# Patient Record
Sex: Female | Born: 2000 | Race: White | Hispanic: No | Marital: Single | Smoking: Never smoker
Health system: Southern US, Community
[De-identification: ages and names within clinical notes are randomized; demographics above are authoritative.]

## PROBLEM LIST (undated history)

## (undated) ENCOUNTER — Inpatient Hospital Stay (HOSPITAL_COMMUNITY): Payer: Self-pay

## (undated) DIAGNOSIS — B009 Herpesviral infection, unspecified: Secondary | ICD-10-CM

## (undated) DIAGNOSIS — J45909 Unspecified asthma, uncomplicated: Secondary | ICD-10-CM

## (undated) HISTORY — PX: NO PAST SURGERIES: SHX2092

---
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
## 2018-04-09 ENCOUNTER — Other Ambulatory Visit: Payer: Self-pay

## 2018-04-09 ENCOUNTER — Encounter (HOSPITAL_COMMUNITY): Payer: Self-pay | Admitting: Emergency Medicine

## 2018-04-09 ENCOUNTER — Emergency Department (HOSPITAL_COMMUNITY)
Admission: EM | Admit: 2018-04-09 | Discharge: 2018-04-09 | Disposition: A | Discharge status: Home / Self Care | Payer: Medicaid Other | Attending: Emergency Medicine | Admitting: Emergency Medicine

## 2018-04-09 DIAGNOSIS — L0291 Cutaneous abscess, unspecified: Secondary | ICD-10-CM

## 2018-04-09 DIAGNOSIS — L02415 Cutaneous abscess of right lower limb: Secondary | ICD-10-CM | POA: Insufficient documentation

## 2018-04-09 DIAGNOSIS — J45909 Unspecified asthma, uncomplicated: Secondary | ICD-10-CM | POA: Insufficient documentation

## 2018-04-09 MED ORDER — ONDANSETRON HCL 4 MG PO TABS
4.0000 mg | ORAL_TABLET | Freq: Once | ORAL | Status: AC
  Administered 2018-04-09: 4 mg via ORAL
  Filled 2018-04-09: qty 1

## 2018-04-09 MED ORDER — TRAMADOL HCL 50 MG PO TABS: 50.0000 mg | ORAL_TABLET | Freq: Four times a day (QID) | ORAL | 0 refills | Status: AC | PRN

## 2018-04-09 MED ORDER — IBUPROFEN 800 MG PO TABS
800.0000 mg | ORAL_TABLET | Freq: Once | ORAL | Status: AC
  Administered 2018-04-09: 800 mg via ORAL
  Filled 2018-04-09: qty 1

## 2018-04-09 MED ORDER — LIDOCAINE HCL (PF) 1 % IJ SOLN
INTRAMUSCULAR | Status: AC
  Filled 2018-04-09: qty 10

## 2018-04-09 MED ORDER — IBUPROFEN 600 MG PO TABS: 600.0000 mg | ORAL_TABLET | Freq: Four times a day (QID) | ORAL | 0 refills | Status: AC

## 2018-04-09 MED ORDER — TRAMADOL HCL 50 MG PO TABS
100.0000 mg | ORAL_TABLET | Freq: Once | ORAL | Status: AC
  Administered 2018-04-09: 100 mg via ORAL
  Filled 2018-04-09: qty 2

## 2018-04-09 MED ORDER — DOXYCYCLINE HYCLATE 100 MG PO CAPS: 100.0000 mg | ORAL_CAPSULE | Freq: Two times a day (BID) | ORAL | 0 refills | Status: AC

## 2018-04-09 MED ORDER — DOXYCYCLINE HYCLATE 100 MG PO TABS
100.0000 mg | ORAL_TABLET | Freq: Once | ORAL | Status: AC
  Administered 2018-04-09: 100 mg via ORAL
  Filled 2018-04-09: qty 1

## 2018-04-09 MED ORDER — POVIDONE-IODINE 10 % EX SOLN
CUTANEOUS | Status: AC
  Filled 2018-04-09: qty 15

## 2018-04-09 MED ORDER — PREDNISONE 20 MG PO TABS
40.0000 mg | ORAL_TABLET | Freq: Once | ORAL | Status: AC
  Administered 2018-04-09: 40 mg via ORAL
  Filled 2018-04-09: qty 2

## 2018-04-09 NOTE — Discharge Instructions (Signed)
Please use warm Epson salt tub soaks for about 10 to 15 minutes daily.  Please use doxycycline 2 times daily with food.  Use ibuprofen every 6 hours or 4 times a day with food.  Please have the abscess and cellulitis area rechecked in about 3 days.  Either by your primary physician or return to the emergency department for recheck.

## 2018-04-09 NOTE — ED Provider Notes (Signed)
[facility] EMERGENCY DEPARTMENT Provider Note   CSN: [id number] Arrival date & time: 04/09/18  1458     History   Chief Complaint Chief Complaint  Patient presents with  . Abscess    HPI Monica Davenport is a 17 y.o. female.  The history is provided by the patient.  Abscess  Location:  Leg Leg abscess location:  R upper leg Abscess quality: painful, redness and warmth   Abscess quality: not draining   Red streaking: no   Duration:  4 days Progression:  Worsening Pain details:    Quality:  Pressure and throbbing   Severity:  Moderate   Duration:  4 days   Timing:  Constant   Progression:  Worsening Chronicity:  New Context: not diabetes and not immunosuppression   Relieved by:  Nothing Exacerbated by: palpation. Ineffective treatments:  None tried Associated symptoms: no fever, no nausea and no vomiting   Risk factors: no hx of MRSA     Past Medical History:  Diagnosis Date  . Asthma   . Herpes     Patient Active Problem List   Diagnosis Date Noted  . Normal delivery 11/17/2017  . Pregnancy 11/17/2017  . Chlamydia infection affecting pregnancy in first trimester 03/24/2017    Past Surgical History:  Procedure Laterality Date  . NO PAST SURGERIES       OB History    Gravida  1   Para      Term      Preterm      AB      Living        SAB      TAB      Ectopic      Multiple      Live Births               Home Medications    Prior to Admission medications   Medication Sig Start Date End Date Taking? Authorizing Provider  chlorpheniramine-HYDROcodone (TUSSIONEX PENNKINETIC ER) 10-8 MG/5ML SUER Take 5 mLs by mouth every 12 (twelve) hours as needed for cough. Patient not taking: Reported on 11/17/2017 09/16/17   Currie Paris, NP  Prenatal Vit-Fe Fumarate-FA (PRENATAL VITAMIN) 27-0.8 MG TABS Take 1 tablet by mouth daily. 03/23/17   Armando Reichert, CNM  valACYclovir (VALTREX) 500 MG tablet Take 500 mg by mouth daily.     [provider]    Family History No family history on file.  Social History Social History   Tobacco Use  . Smoking status: Never Smoker  . Smokeless tobacco: Never Used  Substance Use Topics  . Alcohol use: No  . Drug use: No     Allergies   Patient has no known allergies.   Review of Systems Review of Systems  Constitutional: Negative for activity change and fever.       All ROS Neg except as noted in HPI  HENT: Negative for nosebleeds.   Eyes: Negative for photophobia and discharge.  Respiratory: Negative for cough, shortness of breath and wheezing.   Cardiovascular: Negative for chest pain and palpitations.  Gastrointestinal: Negative for abdominal pain, blood in stool, nausea and vomiting.  Genitourinary: Negative for dysuria, frequency and hematuria.  Musculoskeletal: Negative for arthralgias, back pain and neck pain.  Skin: Negative.        abscess  Neurological: Negative for dizziness, seizures and speech difficulty.  Psychiatric/Behavioral: Negative for confusion and hallucinations.     Physical Exam Updated Vital Signs BP 134/74 (BP  Location: Right Arm)   Pulse (!) 107   Temp 97.8 F (36.6 C) (Oral)   Resp 18   Ht 5\' 3"  (1.6 m)   Wt 81.6 kg (180 lb)   LMP 02/16/2018   SpO2 97%   BMI 31.89 kg/m   Physical Exam  Constitutional: She is oriented to person, place, and time. She appears well-developed and well-nourished.  Non-toxic appearance.  HENT:  Head: Normocephalic.  Right Ear: Tympanic membrane and external ear normal.  Left Ear: Tympanic membrane and external ear normal.  Eyes: Pupils are equal, round, and reactive to light. EOM and lids are normal.  Neck: Normal range of motion. Neck supple. Carotid bruit is not present.  Cardiovascular: Normal rate, regular rhythm, normal heart sounds, intact distal pulses and normal pulses.  Pulmonary/Chest: Breath sounds normal. No respiratory distress.  Abdominal: Soft. Bowel sounds are  normal. There is no tenderness. There is no guarding.  Musculoskeletal: Normal range of motion.  Lymphadenopathy:       Head (right side): No submandibular adenopathy present.       Head (left side): No submandibular adenopathy present.    She has no cervical adenopathy.  No palpable inguinal nodes appreciated.  Neurological: She is alert and oriented to person, place, and time. She has normal strength. No cranial nerve deficit or sensory deficit.  Skin: Skin is warm and dry.     Psychiatric: She has a normal mood and affect. Her speech is normal.  Nursing note and vitals reviewed.    ED Treatments / Results  Labs (all labs ordered are listed, but only abnormal results are displayed) Labs Reviewed - No data to display  EKG None  Radiology No results found.  Procedures Procedures (including critical care time)  Medications Ordered in ED Medications  povidone-iodine (BETADINE) 10 % external solution (has no administration in time range)  lidocaine (PF) (XYLOCAINE) 1 % injection (has no administration in time range)     Initial Impression / Assessment and Plan / ED Course  I have reviewed the triage vital signs and the nursing notes.  Pertinent labs & imaging results that were available during my care of the patient were reviewed by me and considered in my medical decision making (see chart for details).       Final Clinical Impressions(s) / ED Diagnoses MDM  Vital signs within normal limits.  Patient has an abscess with area of cellulitis around the upper inner aspect of the right thigh. There is no high fever reported.  The patient will be treated with warm Epson salt soaks, doxycycline, and ibuprofen.  Patient is to have the area rechecked in about 3 days.  Patient will return sooner if any changes in condition, signs of advancing infection, problems, or concerns.   Final diagnoses:  Abscess    ED Discharge Orders        Ordered    doxycycline (VIBRAMYCIN)  100 MG capsule  2 times daily     04/09/18 1639    traMADol (ULTRAM) 50 MG tablet  Every 6 hours PRN     04/09/18 1639    ibuprofen (ADVIL,MOTRIN) 600 MG tablet  4 times daily     04/09/18 1639       Ivery QualeBryant, Shae Hinnenkamp, PA-C 04/09/18 1649    Terrilee FilesButler, Michael C, MD 04/10/18 [phone]

## 2018-04-09 NOTE — ED Triage Notes (Signed)
Boil to Rt inner thigh x 4 days
